# Patient Record
Sex: Male | Born: 1995 | Hispanic: No | Marital: Single | Smoking: Never smoker
Health system: Southern US, Community
[De-identification: ages and names within clinical notes are randomized; demographics above are authoritative.]

## PROBLEM LIST (undated history)

## (undated) ENCOUNTER — Emergency Department (HOSPITAL_COMMUNITY): Admission: EM | Payer: Self-pay | Source: Home / Self Care

## (undated) HISTORY — PX: FRACTURE SURGERY: SHX138

## (undated) HISTORY — PX: OTHER SURGICAL HISTORY: SHX169

---
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
## 2016-01-20 IMAGING — CR DG FINGER MIDDLE 2+V*L*
1 series · 1 of 1 positions shown · non-contrast
Comparison: No priors.

CLINICAL DATA: 18-year-old male with pain in the left middle finger
after a jamming injury to the finger during a basketball game today.

EXAM:
LEFT MIDDLE FINGER 2+V

[view not recorded]
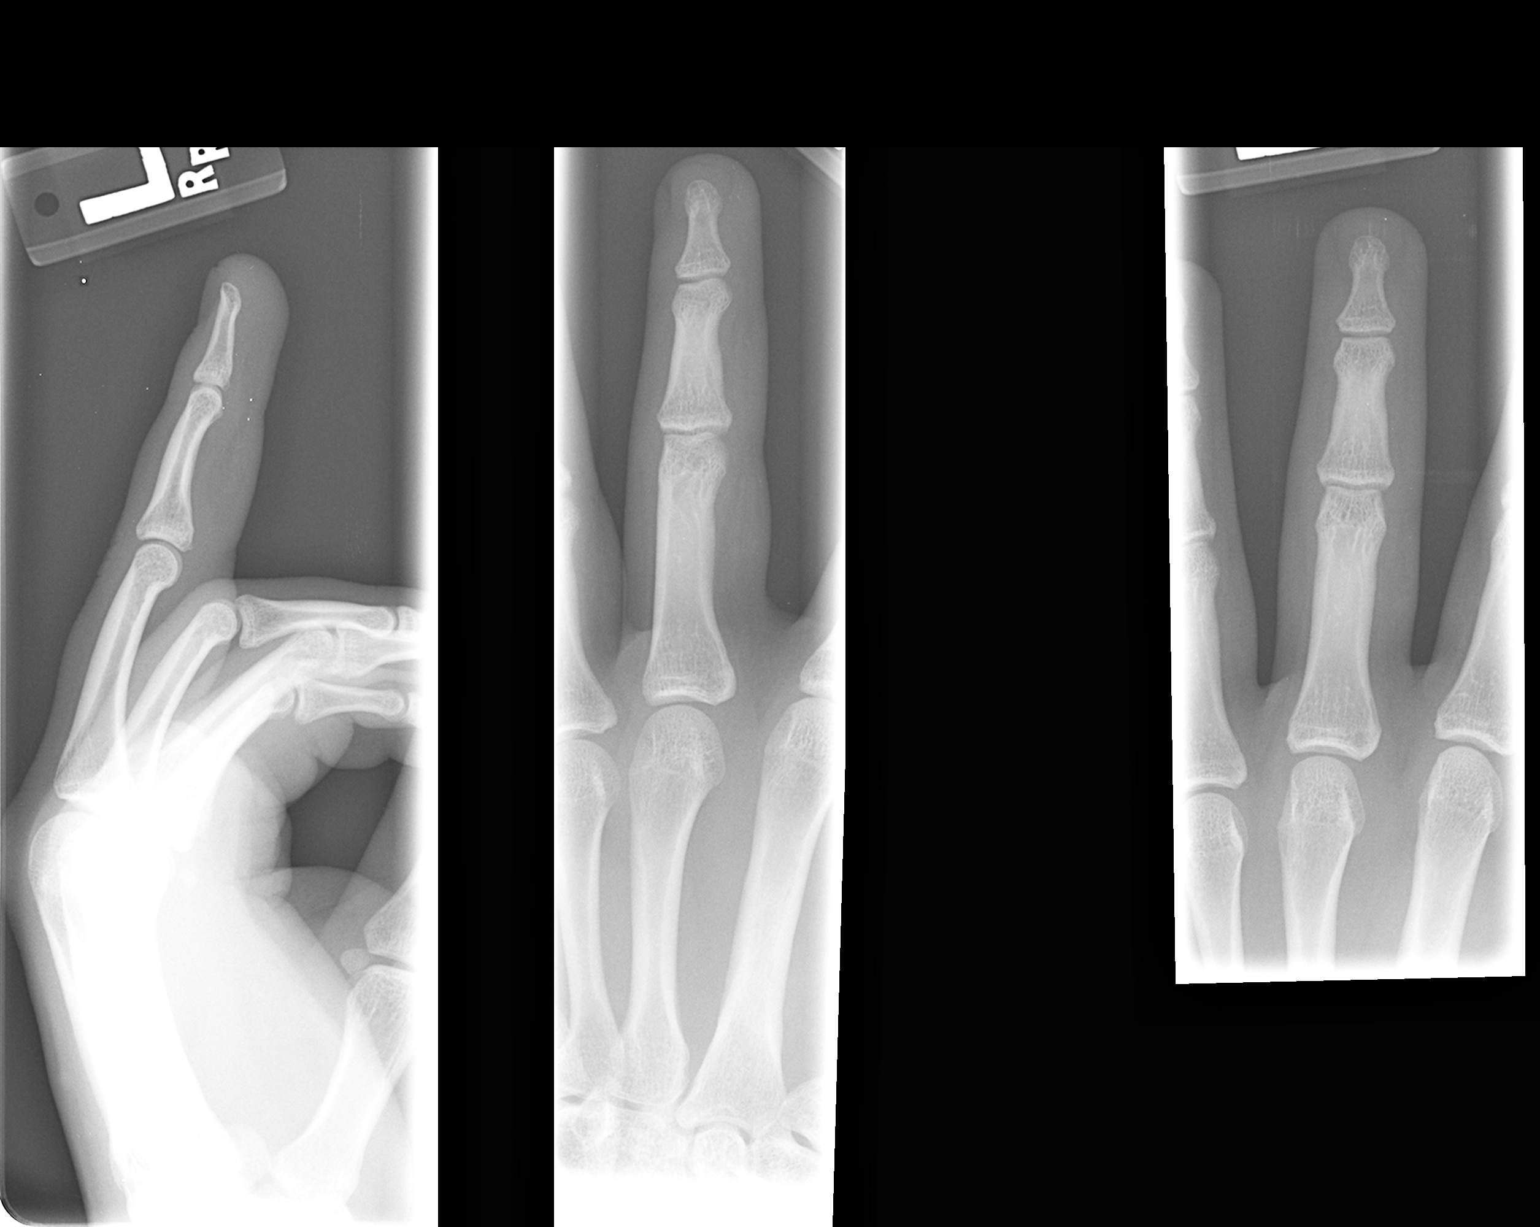

[1 of 1 positions shown; findings below may reference images not displayed]

FINDINGS: There is no evidence of fracture or dislocation. There is no
evidence of arthropathy or other focal bone abnormality. Soft
tissues are unremarkable.
IMPRESSION: Negative.

## 2016-10-05 ENCOUNTER — Ambulatory Visit (INDEPENDENT_AMBULATORY_CARE_PROVIDER_SITE_OTHER): Payer: Self-pay

## 2016-10-05 DIAGNOSIS — Z111 Encounter for screening for respiratory tuberculosis: Secondary | ICD-10-CM

## 2016-10-07 LAB — TB SKIN TEST
Induration: 0 mm
TB Skin Test: NEGATIVE

## 2022-09-13 ENCOUNTER — Emergency Department (HOSPITAL_COMMUNITY)
Admission: EM | Admit: 2022-09-13 | Discharge: 2022-09-13 | Disposition: A | Discharge status: Home / Self Care | Payer: Managed Care, Other (non HMO) | Attending: Emergency Medicine | Admitting: Emergency Medicine

## 2022-09-13 ENCOUNTER — Other Ambulatory Visit: Payer: Self-pay

## 2022-09-13 ENCOUNTER — Emergency Department (HOSPITAL_COMMUNITY): Payer: Managed Care, Other (non HMO)

## 2022-09-13 ENCOUNTER — Encounter (HOSPITAL_COMMUNITY): Payer: Self-pay | Admitting: Emergency Medicine

## 2022-09-13 DIAGNOSIS — M25571 Pain in right ankle and joints of right foot: Secondary | ICD-10-CM | POA: Diagnosis present

## 2022-09-13 NOTE — ED Provider Notes (Signed)
[place] EMERGENCY DEPARTMENT AT [facility] Provider Note   CSN: [id number] Arrival date & time: 09/13/22  1928     History  Chief Complaint  Patient presents with   Ankle Pain    Gary Ritter is a 27 y.o. male.  Patient complains of right-sided ankle pain which began on Sunday.  Patient states he was helping a friend to work on a vehicle when he stretched fell hitting him directly in his lower right leg and ankle.  Patient was seen earlier today in urgent care for the same who apparently was unable to perform x-rays and sent him to the emergency department for x-rays.  He was administered a tetanus shot due to a laceration on the lower medial right leg.  No relevant past medical history on file  HPI     Home Medications Prior to Admission medications   Not on File      Allergies    Patient has no known allergies.    Review of Systems   Review of Systems  Physical Exam Updated Vital Signs BP (!) 159/97   Pulse 80   Temp 98.2 F (36.8 C) (Oral)   Resp 16   Ht 5\' 6"  (1.676 m)   Wt 97.5 kg   SpO2 98%   BMI 34.70 kg/m  Physical Exam HENT:     Head: Normocephalic and atraumatic.  Eyes:     Conjunctiva/sclera: Conjunctivae normal.  Cardiovascular:     Rate and Rhythm: Normal rate.  Pulmonary:     Effort: Pulmonary effort is normal. No respiratory distress.  Musculoskeletal:        General: Swelling, tenderness and signs of injury present. No deformity. Normal range of motion.     Cervical back: Normal range of motion.     Comments: Patient with swelling to the right ankle, medial malleolus tenderness, no deformity.  Patient with bruising to the top of the medial portion of the right foot, small laceration to the medial portion of the lower right leg.  No signs of infection at this time.  Skin:    General: Skin is dry.  Neurological:     Mental Status: He is alert.  Psychiatric:        Speech: Speech normal.        Behavior: Behavior normal.      ED Results / Procedures / Treatments   Labs (all labs ordered are listed, but only abnormal results are displayed) Labs Reviewed - No data to display  EKG None  Radiology DG Ankle Complete Right  Result Date: 09/13/2022 CLINICAL DATA:  Ankle pain after injury EXAM: RIGHT ANKLE - COMPLETE 3+ VIEW COMPARISON:  None Available. FINDINGS: There is soft tissue swelling surrounding the ankle. There is no acute fracture or dislocation. Joint spaces are well maintained. IMPRESSION: Soft tissue swelling without acute fracture or dislocation. Electronically Signed   By: Darliss Cheney M.D.   On: 09/13/2022 19:54    Procedures .Ortho Injury Treatment  Date/Time: 09/13/2022 8:22 PM  Performed by: Darrick Grinder, PA-C Authorized by: Darrick Grinder, PA-C   Consent:    Consent obtained:  Verbal   Consent given by:  Patient   Risks discussed:  Vascular damage, stiffness, restricted joint movement and nerve damage   Alternatives discussed:  No treatmentInjury location: ankle Location details: right ankle Injury type: soft tissue Pre-procedure neurovascular assessment: neurovascularly intact Immobilization: brace Splint Applied by: ED Nurse Post-procedure neurovascular assessment: post-procedure neurovascularly intact  Medications Ordered in ED Medications - No data to display  ED Course/ Medical Decision Making/ A&P                             Medical Decision Making Amount and/or Complexity of Data Reviewed Radiology: ordered.   Patient presents with a chief complaint of right ankle pain.  Differential diagnosis includes but is not limited to fracture, dislocation, soft tissue injury, and others  I reviewed outside medical records including urgent care notes from earlier today.  Patient was administered a Tdap booster.  Urgent care note stated they had no x-ray resources and it was too late in the day to order an outpatient x-ray so they recommended emergency  department follow-up  I ordered and interpreted imaging including plain films of the right ankle.  X-rays were negative for acute fracture or abnormality.  I agree with the radiologist findings  I ordered the patient a brace for the right ankle.  I discussed crutches with the patient but he declined.  Patient with swelling of the right ankle but no acute fracture or dislocation.  Patient states he is able to bear weight on the ankle.  Range of motion is normal.  No signs of compartment syndrome.  Presentation consistent with soft tissue injury.  Patient placed in ankle brace for discharge home.  Patient provided orthopedic follow-up information to be used as needed.  Patient will use over-the-counter medications for pain and inflammation.        Final Clinical Impression(s) / ED Diagnoses Final diagnoses:  Acute right ankle pain    Rx / DC Orders ED Discharge Orders     None         Pamala Duffel 09/13/22 2025    Eber Hong, MD 09/15/22 [phone]

## 2022-09-13 NOTE — Discharge Instructions (Signed)
You were seen today for right ankle swelling secondary to an injury.  Your x-rays were reassuring for no fracture or dislocation.  The braces for support due to a soft tissue injury.  You may take this off for bathing or as needed.  If you continue to have pain I recommend following up with orthopedics.  You may take over-the-counter medication such as ibuprofen or Tylenol for pain.

## 2022-09-13 NOTE — ED Triage Notes (Signed)
Pt c/o right ankle pain with swelling and bruising since Sunday after when a truck strut fell on his ankle. Pt was seen at [facility] and sent here for xrays. Pt was given a tetanus shot for laceration on right ankle as well at [facility].

## 2023-10-23 ENCOUNTER — Emergency Department (HOSPITAL_COMMUNITY)
Admission: EM | Admit: 2023-10-23 | Discharge: 2023-10-24 | Disposition: A | Discharge status: Home / Self Care | Payer: Self-pay | Attending: Emergency Medicine | Admitting: Emergency Medicine

## 2023-10-23 ENCOUNTER — Encounter (HOSPITAL_COMMUNITY): Payer: Self-pay | Admitting: Emergency Medicine

## 2023-10-23 ENCOUNTER — Other Ambulatory Visit: Payer: Self-pay

## 2023-10-23 ENCOUNTER — Emergency Department (HOSPITAL_COMMUNITY): Payer: Self-pay

## 2023-10-23 DIAGNOSIS — K219 Gastro-esophageal reflux disease without esophagitis: Secondary | ICD-10-CM | POA: Diagnosis not present

## 2023-10-23 DIAGNOSIS — R1013 Epigastric pain: Secondary | ICD-10-CM | POA: Diagnosis present

## 2023-10-23 LAB — COMPREHENSIVE METABOLIC PANEL WITH GFR
ALT: 43 U/L (ref 0–44)
AST: 21 U/L (ref 15–41)
Albumin: 4.4 g/dL (ref 3.5–5.0)
Alkaline Phosphatase: 85 U/L (ref 38–126)
Anion gap: 10 (ref 5–15)
BUN: 16 mg/dL (ref 6–20)
CO2: 23 mmol/L (ref 22–32)
Calcium: 8.9 mg/dL (ref 8.9–10.3)
Chloride: 105 mmol/L (ref 98–111)
Creatinine, Ser: 0.92 mg/dL (ref 0.61–1.24)
GFR, Estimated: >60 mL/min (ref >60)
Glucose, Bld: 136 mg/dL — ABNORMAL HIGH (ref 70–99)
Potassium: 3.6 mmol/L (ref 3.5–5.1)
Sodium: 138 mmol/L (ref 135–145)
Total Bilirubin: 0.8 mg/dL (ref 0.0–1.2)
Total Protein: 7.9 g/dL (ref 6.5–8.1)

## 2023-10-23 LAB — CBC
HCT: 42 % (ref 39.0–52.0)
Hemoglobin: 15 g/dL (ref 13.0–17.0)
MCH: 29.1 pg (ref 26.0–34.0)
MCHC: 35.7 g/dL (ref 30.0–36.0)
MCV: 81.4 fL (ref 80.0–100.0)
Platelets: 304 10*3/uL (ref 150–400)
RBC: 5.16 MIL/uL (ref 4.22–5.81)
RDW: 11.8 % (ref 11.5–15.5)
WBC: 7.9 10*3/uL (ref 4.0–10.5)
nRBC: 0 % (ref 0.0–0.2)

## 2023-10-23 MED ORDER — ALUM & MAG HYDROXIDE-SIMETH 200-200-20 MG/5ML PO SUSP
30.0000 mL | Freq: Once | ORAL | Status: AC
  Administered 2023-10-23: 30 mL via ORAL
  Filled 2023-10-23: qty 30

## 2023-10-23 NOTE — ED Triage Notes (Signed)
 Pt with c/o epigastric pain pta.

## 2023-10-23 NOTE — ED Notes (Signed)
 Pt denies pain at this time, pt says he feels better.

## 2023-10-23 NOTE — ED Notes (Signed)
 ED Provider at bedside.

## 2023-10-23 NOTE — ED Provider Notes (Signed)
   EMERGENCY DEPARTMENT AT [facility] Provider Note   CSN: [id number] Arrival date & time: 10/23/23  2137     History {Add pertinent medical, surgical, social history, OB history to HPI:1} Chief Complaint  Patient presents with   Chest Pain    Gary Ritter is a 28 y.o. male.   Chest Pain Patient presents for***.  Medical history includes***     Home Medications Prior to Admission medications   Not on File      Allergies    Patient has no known allergies.    Review of Systems   Review of Systems  Cardiovascular:  Positive for chest pain.    Physical Exam Updated Vital Signs BP (!) 152/94 (BP Location: Right Arm)   Pulse 91   Temp 98.5 F (36.9 C)   Resp 17   Ht 5\' 6"  (1.676 m)   Wt 102.5 kg   SpO2 100%   BMI 36.48 kg/m  Physical Exam  ED Results / Procedures / Treatments   Labs (all labs ordered are listed, but only abnormal results are displayed) Labs Reviewed  CBC  LIPASE, BLOOD  COMPREHENSIVE METABOLIC PANEL WITH GFR    EKG None  Radiology No results found.  Procedures Procedures  {Document cardiac monitor, telemetry assessment procedure when appropriate:1}  Medications Ordered in ED Medications  alum & mag hydroxide-simeth (MAALOX/MYLANTA) 200-200-20 MG/5ML suspension 30 mL (30 mLs Oral Given 10/23/23 2320)    ED Course/ Medical Decision Making/ A&P   {   Click here for ABCD2, HEART and other calculatorsREFRESH Note before signing :1}                              Medical Decision Making Amount and/or Complexity of Data Reviewed Labs: ordered. Radiology: ordered.   ***  {Document critical care time when appropriate:1} {Document review of labs and clinical decision tools ie heart score, Chads2Vasc2 etc:1}  {Document your independent review of radiology images, and any outside records:1} {Document your discussion with family members, caretakers, and with consultants:1} {Document social determinants of health  affecting pt's care:1} {Document your decision making why or why not admission, treatments were needed:1} Final Clinical Impression(s) / ED Diagnoses Final diagnoses:  None    Rx / DC Orders ED Discharge Orders     None

## 2023-10-24 LAB — LIPASE, BLOOD: Lipase: 33 U/L (ref 11–51)

## 2023-10-24 NOTE — Discharge Instructions (Addendum)
 Your test results today are reassuring.  Your symptoms are consistent with gastroesophageal reflux disease (GERD).  For management going forward, avoid foods that cause chest discomfort.  Take over-the-counter Maalox or Mylanta as needed.  If you have frequent symptoms, you could also get started on a PPI such as pantoprazole or omeprazole.  Discuss this further with your primary care doctor.    A prescription for low-dose blood pressure medication was sent to your pharmacy.  Take this daily and continue to monitor your blood pressures.  Discuss further with primary care doctor.    Return to the emergency department for any new or worsening symptoms of concern.
